# Patient Record
Sex: Female | Born: 1945 | Race: White | Hispanic: Yes | Smoking: Never smoker
Health system: Southern US, Community
[De-identification: ages and names within clinical notes are randomized; demographics above are authoritative.]

---

## 2020-08-09 ENCOUNTER — Ambulatory Visit (INDEPENDENT_AMBULATORY_CARE_PROVIDER_SITE_OTHER): Payer: Medicaid Other | Admitting: Internal Medicine

## 2020-08-09 ENCOUNTER — Encounter: Payer: Self-pay | Admitting: Internal Medicine

## 2020-08-09 ENCOUNTER — Other Ambulatory Visit: Payer: Self-pay

## 2020-08-09 VITALS — BP 116/60 | HR 72 | Ht 61.0 in | Wt 171.2 lb

## 2020-08-09 DIAGNOSIS — J849 Interstitial pulmonary disease, unspecified: Secondary | ICD-10-CM | POA: Diagnosis not present

## 2020-08-09 DIAGNOSIS — R0600 Dyspnea, unspecified: Secondary | ICD-10-CM

## 2020-08-09 DIAGNOSIS — R0609 Other forms of dyspnea: Secondary | ICD-10-CM

## 2020-08-09 DIAGNOSIS — R053 Chronic cough: Secondary | ICD-10-CM

## 2020-08-09 DIAGNOSIS — I358 Other nonrheumatic aortic valve disorders: Secondary | ICD-10-CM | POA: Diagnosis not present

## 2020-08-09 LAB — SEDIMENTATION RATE: Sed Rate: 12 mm/hr (ref 0–30)

## 2020-08-09 NOTE — Patient Instructions (Signed)
ICD-10-CM   1. ILD (interstitial lung disease) ([place])  J84.9     2. Dyspnea on exertion  R06.00     3. Chronic cough  R05.3     4. Aortic systolic murmur on examination  I35.8        - I am concerned you  have Interstitial Lung Disease (ILD) or Pulmonary FIbrosis (PF)  -  There are MANY varieties of this - To narrow down possibilities and assess severity please do the following tests  - do full PFT  - do walking test on room air in the office (not 6 min walk test) today or next visit  - do overnight oxygen test on room air  - do autoimmune panel: Serum: ESR, ANA, DS-DNA, RF, anti-CCP, ssA, ssB, scl-70, Total CK,  Aldolase,  Hypersensitivity Pneumonitis Panel, Quantiferon GOld  - do ECHO  Followup - reutrn in 4-8 weeks to see Dr Chase Caller to discuss test results and next step - 30 min visit
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
## 2020-08-11 LAB — QUANTIFERON-TB GOLD PLUS
Mitogen-NIL: >10 IU/mL
NIL: 1.04 IU/mL
QuantiFERON-TB Gold Plus: NEGATIVE
TB1-NIL: <0 IU/mL
TB2-NIL: <0 IU/mL

## 2020-08-11 LAB — CK TOTAL AND CKMB (NOT AT ARMC)
CK, MB: 0.9 ng/mL (ref 0–5.0)
Relative Index: 1.3 (ref 0–4.0)
Total CK: 67 U/L (ref 29–143)

## 2020-08-11 LAB — SJOGREN'S SYNDROME ANTIBODS(SSA + SSB)
SSA (Ro) (ENA) Antibody, IgG: <1 AI
SSB (La) (ENA) Antibody, IgG: <1 AI

## 2020-08-11 LAB — ANA: Anti Nuclear Antibody (ANA): NEGATIVE

## 2020-08-11 LAB — RHEUMATOID FACTOR: Rheumatoid fact SerPl-aCnc: <14 IU/mL (ref <14)

## 2020-08-11 LAB — ANTI-SCLERODERMA ANTIBODY: Scleroderma (Scl-70) (ENA) Antibody, IgG: <1 AI

## 2020-08-11 LAB — CYCLIC CITRUL PEPTIDE ANTIBODY, IGG: Cyclic Citrullin Peptide Ab: <16 UNITS

## 2020-08-11 LAB — ANTI-DNA ANTIBODY, DOUBLE-STRANDED: ds DNA Ab: 2 IU/mL

## 2020-08-11 LAB — ALDOLASE: Aldolase: 3.7 U/L (ref <=8.1)

## 2020-08-13 LAB — HYPERSENSITIVITY PNEUMONITIS
A. Pullulans Abs: NEGATIVE
A.Fumigatus #1 Abs: NEGATIVE
Micropolyspora faeni, IgG: NEGATIVE
Pigeon Serum Abs: NEGATIVE
Thermoact. Saccharii: NEGATIVE
Thermoactinomyces vulgaris, IgG: NEGATIVE

## 2020-08-15 NOTE — Progress Notes (Signed)
Serology for ILD work up is negative. Plse ensure followup per recent OV. I do not see followup. Thanks

## 2020-08-26 ENCOUNTER — Telehealth: Payer: Self-pay | Admitting: Internal Medicine

## 2020-08-26 ENCOUNTER — Encounter: Payer: Self-pay | Admitting: *Deleted

## 2020-08-26 NOTE — Telephone Encounter (Signed)
Call returned to patient daughter, confirmed DOB. Made aware of lab results. Voiced understanding.   Nothing further needed at this time.

## 2020-08-26 NOTE — Telephone Encounter (Signed)
ATC left voicemail to return call to office  

## 2020-08-29 ENCOUNTER — Telehealth: Payer: Self-pay | Admitting: Internal Medicine

## 2020-08-29 NOTE — Telephone Encounter (Signed)
Ono on 08/11/20 - pulse  <= 88% for 40sec  Pl;an  - no need for night o2

## 2020-08-30 ENCOUNTER — Other Ambulatory Visit: Payer: Self-pay

## 2020-08-30 ENCOUNTER — Ambulatory Visit (HOSPITAL_COMMUNITY)
Admission: RE | Admit: 2020-08-30 | Discharge: 2020-08-30 | Disposition: A | Discharge status: Home / Self Care | Payer: Medicaid Other | Source: Ambulatory Visit | Attending: Internal Medicine | Admitting: Internal Medicine

## 2020-08-30 DIAGNOSIS — J849 Interstitial pulmonary disease, unspecified: Secondary | ICD-10-CM | POA: Insufficient documentation

## 2020-08-30 DIAGNOSIS — I082 Rheumatic disorders of both aortic and tricuspid valves: Secondary | ICD-10-CM | POA: Insufficient documentation

## 2020-08-30 DIAGNOSIS — R0600 Dyspnea, unspecified: Secondary | ICD-10-CM | POA: Insufficient documentation

## 2020-08-30 DIAGNOSIS — R0609 Other forms of dyspnea: Secondary | ICD-10-CM | POA: Diagnosis not present

## 2020-08-30 LAB — ECHOCARDIOGRAM COMPLETE
Area-P 1/2: 3.6 cm2
P 1/2 time: 439 msec
S' Lateral: 2.2 cm

## 2020-08-30 NOTE — Telephone Encounter (Signed)
Called and spoke with pt's daughter Cathi Roan letting her know the results of pt's ONO and she verbalized understanding. Nothing  further needed.

## 2020-10-19 ENCOUNTER — Ambulatory Visit (INDEPENDENT_AMBULATORY_CARE_PROVIDER_SITE_OTHER): Payer: Medicaid Other | Admitting: Internal Medicine

## 2020-10-19 ENCOUNTER — Telehealth: Payer: Self-pay | Admitting: Internal Medicine

## 2020-10-19 ENCOUNTER — Other Ambulatory Visit: Payer: Self-pay

## 2020-10-19 ENCOUNTER — Encounter: Payer: Self-pay | Admitting: Internal Medicine

## 2020-10-19 VITALS — BP 108/70 | HR 75 | Temp 98.0°F | Ht 60.0 in | Wt 168.8 lb

## 2020-10-19 DIAGNOSIS — J849 Interstitial pulmonary disease, unspecified: Secondary | ICD-10-CM

## 2020-10-19 LAB — PULMONARY FUNCTION TEST
DL/VA % pred: 99 %
DL/VA: 4.2 ml/min/mmHg/L
DLCO cor % pred: 87 %
DLCO cor: 14.63 ml/min/mmHg
DLCO unc % pred: 88 %
DLCO unc: 14.72 ml/min/mmHg
FEF 25-75 Post: 2.71 L/s
FEF 25-75 Pre: 2.3 L/s
FEF2575-%Change-Post: 17 %
FEF2575-%Pred-Post: 182 %
FEF2575-%Pred-Pre: 155 %
FEV1-%Change-Post: 3 %
FEV1-%Pred-Post: 104 %
FEV1-%Pred-Pre: 101 %
FEV1-Post: 1.86 L
FEV1-Pre: 1.8 L
FEV1FVC-%Change-Post: 5 %
FEV1FVC-%Pred-Pre: 113 %
FEV6-%Change-Post: -1 %
FEV6-%Pred-Post: 92 %
FEV6-%Pred-Pre: 94 %
FEV6-Post: 2.09 L
FEV6-Pre: 2.13 L
FEV6FVC-%Pred-Post: 105 %
FEV6FVC-%Pred-Pre: 105 %
FVC-%Change-Post: -1 %
FVC-%Pred-Post: 87 %
FVC-%Pred-Pre: 89 %
FVC-Post: 2.09 L
FVC-Pre: 2.13 L
Post FEV1/FVC ratio: 89 %
Post FEV6/FVC ratio: 100 %
Pre FEV1/FVC ratio: 85 %
Pre FEV6/FVC Ratio: 100 %
RV % pred: 57 %
RV: 1.19 L
TLC % pred: 81 %
TLC: 3.63 L
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
## 2020-10-19 NOTE — Telephone Encounter (Signed)
    For radiology addendum:   - please call radiologist assistant line [phone] and specify Rachel Moody , 02-20-1946 and [id number] - mention imaging type ct chest at [facility] with different MRN  and date in PACS - see my note for the MRN - request addendum for purpose of - get that CT report to flow into this chart.    Please send phone message back when done  Thanks    SIGNATURE    Dr. Kalman Shan, M.D., F.C.C.P,  Pulmonary and Critical Care Medicine Staff Physician, [facility] Center Director - Interstitial Lung Disease  Program  Pulmonary Fibrosis [facility] at [facility] [place], [place], [address]  Pager: [phone], If no answer  OR between  19:00-7:00h: page [phone] Telephone (clinical office): [phone] Telephone (research): [phone]  5:46 PM 10/19/2020

## 2020-10-19 NOTE — Patient Instructions (Addendum)
ICD-10-CM   1. ILD (interstitial lung disease) (HCC)  J84.9     2. Dyspnea on exertion  R06.00     3. Chronic cough  R05.3     4. Aortic systolic murmur on examination  I35.8      ILD is very mild based on mild symptom, normal walking desaturation test and normal pulmonary function test  Symptom component is likely due to stiff heart muscle called diastolic dysfunction, weight and physical deconditioning and to some extent interstitial lung disease  Echo shows a mild amount of aortic valve regurgitation -please take this up with your cardiologist or primary care physician but probably no intervention  Plan - Details decision making and discussion about biopsy versus expectant follow-up  -Respect her reluctance for biopsy  -Through shared decision making to follow-up clinically -I will discuss your case at interstitial lung disease conference -Do high-resolution CT chest supine and prone in 6 months  Follow-up - Return in 6 months in a 15-minute slot to see Dr. Marchelle Gearing but after high-resolution CT chest  -Please do not do the CT scan in [place] but do it in [place] because it is a lot more convenient for our review if you do it in [place]

## 2020-10-19 NOTE — Progress Notes (Signed)
PFT done today. 

## 2020-10-22 NOTE — Telephone Encounter (Signed)
[facility] radiology and spoke with [facility] about the message stated by MR. When Stacie tried locating pt, she was not able to locate pt in the system. Stacie said that she was going to call [facility] to try to see if she could get this straightened out and then would call us back.

## 2021-04-26 ENCOUNTER — Ambulatory Visit
Admission: RE | Admit: 2021-04-26 | Discharge: 2021-04-26 | Disposition: A | Discharge status: Home / Self Care | Payer: Medicaid Other | Source: Ambulatory Visit | Attending: Internal Medicine | Admitting: Internal Medicine

## 2021-04-26 ENCOUNTER — Other Ambulatory Visit: Payer: Self-pay

## 2021-04-26 DIAGNOSIS — J849 Interstitial pulmonary disease, unspecified: Secondary | ICD-10-CM

## 2021-06-13 ENCOUNTER — Encounter: Payer: Self-pay | Admitting: Internal Medicine

## 2021-06-13 ENCOUNTER — Ambulatory Visit (INDEPENDENT_AMBULATORY_CARE_PROVIDER_SITE_OTHER): Payer: Medicaid Other | Admitting: Internal Medicine

## 2021-06-13 VITALS — BP 130/74 | HR 79 | Temp 98.2°F | Ht 60.0 in | Wt 167.0 lb

## 2021-06-13 DIAGNOSIS — J849 Interstitial pulmonary disease, unspecified: Secondary | ICD-10-CM

## 2021-06-13 NOTE — Patient Instructions (Addendum)
ICD-10-CM   ?1. ILD (interstitial lung disease) (HCC)  J84.9   ?  ? ? ?ILD is very mild based ?This can get worse in the future but currently you are stable ? ?Plan - per shared decision making ?- No biopsy ?- Expectant followup ?- Do spirometry and dlco in 6 months ? ?Follow-up ?- Return in 6 months in a 30-minute slot to see Dr. Marchelle Gearing but after PFT ?   - Symptom score and walk test at followup ?
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
## 2021-06-14 ENCOUNTER — Telehealth: Payer: Self-pay | Admitting: Internal Medicine

## 2021-06-14 NOTE — Telephone Encounter (Signed)
Pt's OV notes as well as echo results have been printed to be faxed to provided fax number. ?

## 2021-06-15 NOTE — Telephone Encounter (Signed)
Echo has been refaxed to provided fax number. Nothing further needed. ?

## 2021-12-15 ENCOUNTER — Ambulatory Visit: Payer: Medicaid Other | Admitting: Internal Medicine

## 2021-12-15 ENCOUNTER — Other Ambulatory Visit: Payer: Self-pay | Admitting: *Deleted

## 2021-12-15 DIAGNOSIS — J849 Interstitial pulmonary disease, unspecified: Secondary | ICD-10-CM

## 2021-12-16 ENCOUNTER — Ambulatory Visit (INDEPENDENT_AMBULATORY_CARE_PROVIDER_SITE_OTHER): Payer: Medicaid Other | Admitting: Internal Medicine

## 2021-12-16 DIAGNOSIS — J849 Interstitial pulmonary disease, unspecified: Secondary | ICD-10-CM

## 2021-12-16 LAB — PULMONARY FUNCTION TEST
DL/VA % pred: 113 %
DL/VA: 4.8 ml/min/mmHg/L
DLCO cor % pred: 87 %
DLCO cor: 14.45 ml/min/mmHg
DLCO unc % pred: 87 %
DLCO unc: 14.45 ml/min/mmHg
FEF 25-75 Pre: 2.31 L/sec
FEF2575-%Pred-Pre: 166 %
FEV1-%Pred-Pre: 100 %
FEV1-Pre: 1.72 L
FEV1FVC-%Pred-Pre: 114 %
FEV6-%Pred-Pre: 92 %
FEV6-Pre: 2 L
FEV6FVC-%Pred-Pre: 105 %
FVC-%Pred-Pre: 87 %
FVC-Pre: 2.02 L
Pre FEV1/FVC ratio: 85 %
Pre FEV6/FVC Ratio: 100 %

## 2021-12-16 NOTE — Patient Instructions (Signed)
Spirometry/DLCO performed today. 

## 2021-12-16 NOTE — Progress Notes (Signed)
Spirometry/DLCO performed today. 

## 2021-12-20 ENCOUNTER — Ambulatory Visit (INDEPENDENT_AMBULATORY_CARE_PROVIDER_SITE_OTHER): Payer: Medicaid Other | Admitting: Internal Medicine

## 2021-12-20 ENCOUNTER — Encounter: Payer: Self-pay | Admitting: Internal Medicine

## 2021-12-20 VITALS — BP 116/68 | HR 58 | Temp 97.8°F | Ht 60.0 in | Wt 168.2 lb

## 2021-12-20 DIAGNOSIS — J849 Interstitial pulmonary disease, unspecified: Secondary | ICD-10-CM | POA: Diagnosis not present

## 2021-12-20 DIAGNOSIS — Z7185 Encounter for immunization safety counseling: Secondary | ICD-10-CM | POA: Diagnosis not present
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
## 2021-12-20 NOTE — Patient Instructions (Addendum)
    ILD  Continues to be stable Noted that you are now potentially interested in antifibrotic therapy Noted that you still are not interested in lung biopsy  Plan  - Pulmonary function test spirometry and DLCO in 3 months -I will discuss in case conference whether we should start upfront antifibrotic therapy or continue to watch   Vaccine counseling  - Glad you had the flu shot  Plan -Recommend RSV vaccine -Recommend COVID mRNA booster for the fall 2023 -Have pneumonia vaccine either with [place] today or through primary care -Please talk to PCP Rhea Bleacher, NP -  and ensure you get  shingrix (GSK) inactivated vaccine against shingles    -Followup  - 3 months - 30 min visit; but after spiro/dlco  - walk and ILD symptoms score at followup

## 2021-12-22 ENCOUNTER — Ambulatory Visit: Payer: Medicaid Other | Admitting: Internal Medicine

## 2022-03-27 ENCOUNTER — Ambulatory Visit: Payer: Medicaid Other | Admitting: Nurse Practitioner

## 2022-03-31 ENCOUNTER — Encounter: Payer: Self-pay | Admitting: Nurse Practitioner

## 2022-03-31 ENCOUNTER — Ambulatory Visit (INDEPENDENT_AMBULATORY_CARE_PROVIDER_SITE_OTHER): Payer: Medicaid Other | Admitting: Internal Medicine

## 2022-03-31 ENCOUNTER — Ambulatory Visit (INDEPENDENT_AMBULATORY_CARE_PROVIDER_SITE_OTHER): Payer: Medicaid Other | Admitting: Nurse Practitioner

## 2022-03-31 VITALS — BP 144/78 | HR 65 | Temp 98.7°F | Ht 60.0 in | Wt 164.2 lb

## 2022-03-31 DIAGNOSIS — R942 Abnormal results of pulmonary function studies: Secondary | ICD-10-CM | POA: Diagnosis not present

## 2022-03-31 DIAGNOSIS — J849 Interstitial pulmonary disease, unspecified: Secondary | ICD-10-CM

## 2022-03-31 LAB — CBC WITH DIFFERENTIAL/PLATELET
Basophils Absolute: 0 10*3/uL (ref 0.0–0.1)
Basophils Relative: 0.3 % (ref 0.0–3.0)
Eosinophils Absolute: 0.1 10*3/uL (ref 0.0–0.7)
Eosinophils Relative: 2.6 % (ref 0.0–5.0)
HCT: 39.7 % (ref 36.0–46.0)
Hemoglobin: 13.2 g/dL (ref 12.0–15.0)
Lymphocytes Relative: 32.6 % (ref 12.0–46.0)
Lymphs Abs: 1.7 10*3/uL (ref 0.7–4.0)
MCHC: 33.2 g/dL (ref 30.0–36.0)
MCV: 89.1 fl (ref 78.0–100.0)
Monocytes Absolute: 0.5 10*3/uL (ref 0.1–1.0)
Monocytes Relative: 10 % (ref 3.0–12.0)
Neutro Abs: 2.9 10*3/uL (ref 1.4–7.7)
Neutrophils Relative %: 54.5 % (ref 43.0–77.0)
Platelets: 188 10*3/uL (ref 150.0–400.0)
RBC: 4.46 Mil/uL (ref 3.87–5.11)
RDW: 14.2 % (ref 11.5–15.5)
WBC: 5.3 10*3/uL (ref 4.0–10.5)

## 2022-03-31 LAB — PULMONARY FUNCTION TEST
DL/VA % pred: 91 %
DL/VA: 3.85 ml/min/mmHg/L
DLCO cor % pred: 79 %
DLCO cor: 13.14 ml/min/mmHg
DLCO unc % pred: 79 %
DLCO unc: 13.14 ml/min/mmHg
FEF 25-75 Pre: 2.39 L/sec
FEF2575-%Pred-Pre: 172 %
FEV1-%Pred-Pre: 103 %
FEV1-Pre: 1.77 L
FEV1FVC-%Pred-Pre: 113 %
FEV6-%Pred-Pre: 93 %
FEV6-Pre: 2.04 L
FEV6FVC-%Pred-Pre: 105 %
FVC-%Pred-Pre: 90 %
FVC-Pre: 2.09 L
Pre FEV1/FVC ratio: 85 %
Pre FEV6/FVC Ratio: 100 %

## 2022-03-31 NOTE — Progress Notes (Signed)
CBC nl. Please let patient know we will plan to repeat her PFTs in 3 months, as discussed. Thanks!

## 2022-03-31 NOTE — Assessment & Plan Note (Addendum)
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 She remains asymptomatic. She had repeat PFTs today with slight reduction in DLCO. We discussed that this could represent progression of disease or it could be related to alternative etiology vs testing variability. We will check CBC today to rule out anemia. Plan to repeat PFTs again in 3 months to reassess. Shared decision to continue with watchful waiting. If her PFTs do show persistent decrease in DLCO or worsening, we will obtain repeat HRCT. If there is evidence of progression, she would be agreeable to antifibrotic therapy.   Patient Instructions  Continue Albuterol inhaler 2 puffs every 6 hours as needed for shortness of breath or wheezing.   Your diffusion capacity was slightly reduced today compared to previous testing. We will plan to check your blood counts to ensure there is no evidence of anemia. If this is normal, we will recheck your pulmonary function testing in 3 months.  Follow up in 12 weeks after spirometry/DLCO with Dr. Chase Caller. If symptoms do not improve or worsen, please contact office for sooner follow up or seek emergency care.

## 2022-03-31 NOTE — Progress Notes (Signed)
Spirometry and DLCO Performed Today.  

## 2022-03-31 NOTE — Patient Instructions (Signed)
Spirometry and DLCO Performed Today.  

## 2022-03-31 NOTE — Patient Instructions (Addendum)
Continue Albuterol inhaler 2 puffs every 6 hours as needed for shortness of breath or wheezing.   Your diffusion capacity was slightly reduced today compared to previous testing. We will plan to check your blood counts to ensure there is no evidence of anemia. If this is normal, we will recheck your pulmonary function testing in 3 months.  Follow up in 12 weeks after spirometry/DLCO with Dr. Chase Caller. If symptoms do not improve or worsen, please contact office for sooner follow up or seek emergency care.

## 2022-03-31 NOTE — Progress Notes (Signed)
$@Patientt$  ID: Rachel Moody, female    DOB: March 07, 1945, 77 y.o.   MRN: [id number]  Chief Complaint  Patient presents with   Follow-up    PFT today.  Patient states doing well.    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 She is a patient of Dr. Golden Pop and last seen in office 12/20/2021. Past medical history significant for HTN, HLD.  TEST/EVENTS:   12/20/2021: OV with Dr. Chase Caller. PFTs were stable. Breathing stable. Walk test was also stable. Opted for no biopsy. 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 Shared decision for expectant follow up but then patient asked about medication options to cure disease. Reviewed role of antifibrotics but unsure if there is a role for this unless it is determined that she does have IPF. Plan to discuss at multidisciplinary conference. Repeat PFTs in 3 months.   03/31/2022: Today - follow up Patient presents today for follow up after PFTs. She is accompanied by her daughter and spanish interpretor. Her spirometry was normal and stable from previous testing. She did have a slight reduction in her DLCO to 79%, which is an 8% drop from her testing in October. She tells me that she has been doing well. She really does not have any respiratory symptoms that she notices. She denies shortness of breath, cough or fatigue. No lightheadedness, dizziness, or weakness.   SYMPTOM SCALE - ILD 08/09/2020   10/19/2020   06/13/2021   12/20/2021   03/31/2022  O2 use ra ra ra ra ra  Shortness of Breath 0 -> 5 scale with 5 being worst (score 6 If unable to do)         At rest 0 0 0 0 0  Simple tasks - showers, clothes change, eating, shaving 0 0 0 0 0  Household (dishes, doing bed, laundry) 0 0 0 0 0  Shopping 0 0.5 0 0 0  Walking level at own pace 1 0.5 0 0 0  Walking up Stairs 3 2.5 0 0 0  Total (30-36) Dyspnea Score 4 3.5 0 0 0  How bad is your cough? 2 2.5 1  0 0  How bad is your fatigue 1 1.5 0 0 0  How bad is nausea 0 0 0 0 0  How bad is vomiting?   0 0 0 0 0  How bad is diarrhea? 0 0 0 0 0  How bad is anxiety? 00 0 0 0 0  How bad is depression 0 0 0 0 0      0 0 0 0      Simple office walk 185 feet x  3 laps goal with forehead probe 10/19/2020   06/13/2021   12/20/2021   03/31/2022  O2 used ra     ra  Number laps completed avg     avg  Comments about pace 3 3 3 $ 3; walked at a very fast pace  Resting Pulse Ox/HR 100% and 75/min 100% and 78 100%  and 74 100%  Final Pulse Ox/HR 98% and 90/min 98% and 100 100% and 85   Desaturated </= 88% no     no  Desaturated <= 3% points no     no  Got Tachycardic >/= 90/min yes       Symptoms at end of test none none   none  Miscellaneous comments x  Not on File  Immunization History  Administered Date(s) Administered   Fluad Quad(high Dose 65+) 12/05/2021   Influenza, High Dose Seasonal PF 11/21/2019   Moderna Sars-Covid-2 Vaccination 04/17/2019, 05/15/2019   PFIZER(Purple Top)SARS-COV-2 Vaccination 03/24/2020    No past medical history on file.  Tobacco History: Social History   Tobacco Use  Smoking Status Never  Smokeless Tobacco Never   Counseling given: Not Answered   Outpatient Medications Prior to Visit  Medication Sig Dispense Refill   Ascorbic Acid (VITAMIN C) 500 MG CAPS Take 500 mg by mouth daily.     hydrochlorothiazide (HYDRODIURIL) 25 MG tablet Take 1 tablet by mouth daily.     losartan (COZAAR) 50 MG tablet Take 1 tablet by mouth 2 (two) times daily.     PROAIR HFA 108 (90 Base) MCG/ACT inhaler Inhale into the lungs.     rosuvastatin (CRESTOR) 10 MG tablet Take 10 mg by mouth at bedtime.     Vitamin D3 (VITAMIN D) 25 MCG tablet Take 1,000 Units by mouth daily.     zinc gluconate 50 MG tablet Take 50 mg by mouth daily. (Patient not taking: Reported on 03/31/2022)     No facility-administered medications prior to visit.     Review of Systems:    Constitutional: No weight loss or gain, night sweats, fevers, chills, fatigue, or lassitude. HEENT: No headaches, difficulty swallowing, tooth/dental problems, or sore throat. No sneezing, itching, ear ache, nasal congestion, or post nasal drip CV:  No chest pain, orthopnea, PND, swelling in lower extremities, anasarca, dizziness, palpitations, syncope Resp: No shortness of breath with exertion or at rest. No excess mucus or change in color of mucus. No productive or non-productive. No hemoptysis. No wheezing.  No chest wall deformity GI:  No heartburn, indigestion, abdominal pain, nausea, vomiting, diarrhea, change in bowel habits, loss of appetite GU: No dysuria, change in color of urine, urgency or frequency.  Skin: No rash, lesions, ulcerations MSK:  No joint pain or swelling.  Neuro: No dizziness or lightheadedness.  Psych: No depression or anxiety. Mood stable.     Physical Exam:  BP (!) 144/78 (BP Location: Right Arm, Patient Position: Sitting, Cuff Size: Normal)   Pulse 65   Temp 98.7 F (37.1 C) (Oral)   Ht 5' (1.524 m)   Wt 164 lb 3.2 oz (74.5 kg)   SpO2 99%   BMI 32.07 kg/m   GEN: Pleasant, interactive, well-appearing; obese; in no acute distress. HEENT:  Normocephalic and atraumatic. PERRLA. Sclera white. Nasal turbinates pink, moist and patent bilaterally. No rhinorrhea present. Oropharynx pink and moist, without exudate or edema. No lesions, ulcerations, or postnasal drip.  NECK:  Supple w/ fair ROM. No JVD present. Normal carotid impulses w/o bruits. Thyroid symmetrical with no goiter or nodules palpated. No lymphadenopathy.   CV: RRR, no m/r/g, no peripheral edema. Pulses intact, +2 bilaterally. No cyanosis, pallor or clubbing. PULMONARY:  Unlabored, regular breathing. Question mild bibasilar crackles otherwise clear bilaterally A&P w/o wheezes/rales/rhonchi. No accessory muscle use.  GI: BS present and normoactive. Soft, non-tender to palpation. No organomegaly or  masses detected.  MSK: No erythema, warmth or tenderness. Cap refil <2 sec all extrem. No deformities or joint swelling noted.  Neuro: A/Ox3. No focal deficits noted.   Skin: Warm, no lesions or rashe Psych: Normal affect and behavior. Judgement and thought content appropriate.     Lab Results:  CBC    Component Value Date/Time   WBC 5.3 03/31/2022 1031   RBC 4.46  03/31/2022 1031   HGB 13.2 03/31/2022 1031   HCT 39.7 03/31/2022 1031   PLT 188.0 03/31/2022 1031   MCV 89.1 03/31/2022 1031   MCHC 33.2 03/31/2022 1031   RDW 14.2 03/31/2022 1031   LYMPHSABS 1.7 03/31/2022 1031   MONOABS 0.5 03/31/2022 1031   EOSABS 0.1 03/31/2022 1031   BASOSABS 0.0 03/31/2022 1031    BMET No results found for: "NA", "K", "CL", "CO2", "GLUCOSE", "BUN", "CREATININE", "CALCIUM", "GFRNONAA", "GFRAA"  BNP No results found for: "BNP"   Imaging:  No results found.       Latest Ref Rng & Units 03/31/2022    9:02 AM 12/16/2021   12:27 PM 10/19/2020    3:02 PM  PFT Results  FVC-Pre L 2.09  P 2.02  2.13   FVC-Predicted Pre % 90  P 87  89   FVC-Post L   2.09   FVC-Predicted Post %   87   Pre FEV1/FVC % % 85  P 85  85   Post FEV1/FCV % %   89   FEV1-Pre L 1.77  P 1.72  1.80   FEV1-Predicted Pre % 103  P 100  101   FEV1-Post L   1.86   DLCO uncorrected ml/min/mmHg 13.14  P 14.45  14.72   DLCO UNC% % 79  P 87  88   DLCO corrected ml/min/mmHg 13.14  P 14.45  14.63   DLCO COR %Predicted % 79  P 87  87   DLVA Predicted % 91  P 113  99   TLC L   3.63   TLC % Predicted %   81   RV % Predicted %   57     P Preliminary result    No results found for: "NITRICOXIDE"      Assessment & Plan:   ILD (interstitial lung disease) (HCC) ILD with mild changes of UIP pattern on imaging but without evidence of progression thus far. She remains asymptomatic. She had repeat PFTs today with slight reduction in DLCO. We discussed that this could represent progression of disease or it could be related to  alternative etiology vs testing variability. We will check CBC today to rule out anemia. Plan to repeat PFTs again in 3 months to reassess. Shared decision to continue with watchful waiting. If her PFTs do show persistent decrease in DLCO or worsening, we will obtain repeat HRCT. If there is evidence of progression, she would be agreeable to antifibrotic therapy.   Patient Instructions  Continue Albuterol inhaler 2 puffs every 6 hours as needed for shortness of breath or wheezing.   Your diffusion capacity was slightly reduced today compared to previous testing. We will plan to check your blood counts to ensure there is no evidence of anemia. If this is normal, we will recheck your pulmonary function testing in 3 months.  Follow up in 12 weeks after spirometry/DLCO with Dr. Chase Caller. If symptoms do not improve or worsen, please contact office for sooner follow up or seek emergency care.       I spent 35 minutes of dedicated to the care of this patient on the date of this encounter to include pre-visit review of records, face-to-face time with the patient discussing conditions above, post visit ordering of testing, clinical documentation with the electronic health record, making appropriate referrals as documented, and communicating necessary findings to members of the patients care team.  Clayton Bibles, NP 03/31/2022  Pt aware and understands NP's role.

## 2022-07-28 IMAGING — CT CT CHEST HIGH RESOLUTION
1 of 6 series · 14 of 34 positions shown, 18 images · non-contrast
Comparison: None.

CLINICAL DATA: Interstitial lung disease, normal PFT, dyspnea and
chronic cough



[Series 10: super d · axial · 0.72mm/px · z∈[-343,-82]mm · 14 of 491 slices shown, 18 images]
[im 28/491  mediastinal]
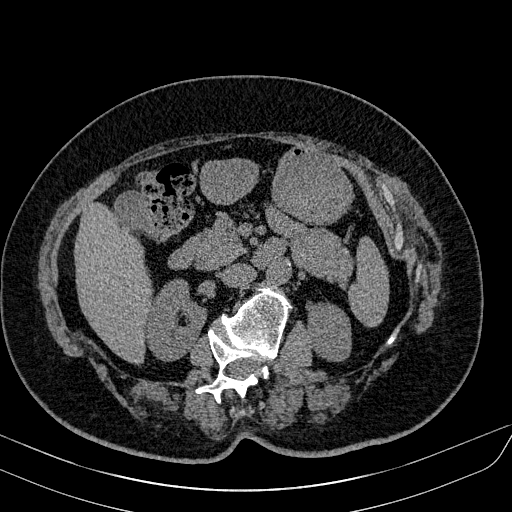
[im 28/491  lung]
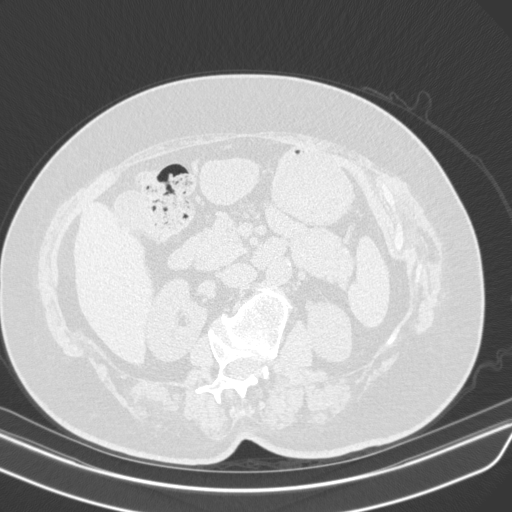
[im 55/491  lung]
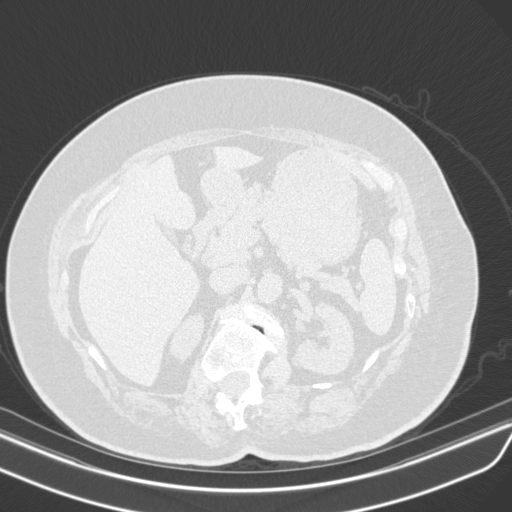
[im 109/491  lung]
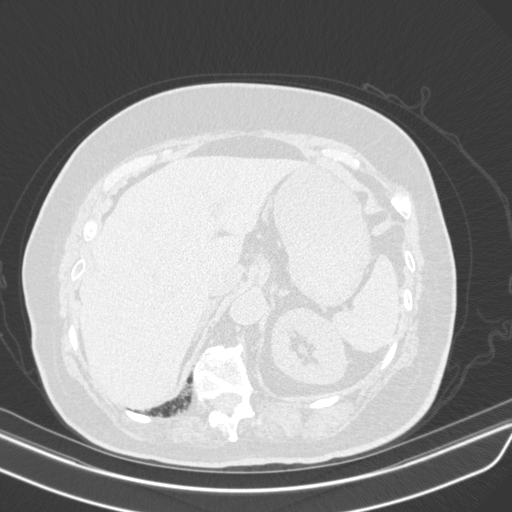
[im 137/491  lung]
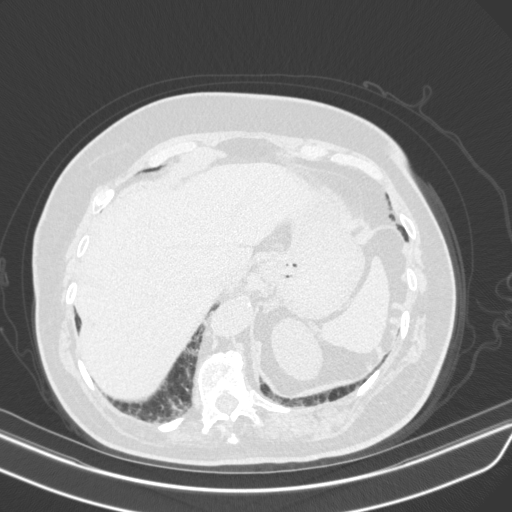
[im 164/491  mediastinal]
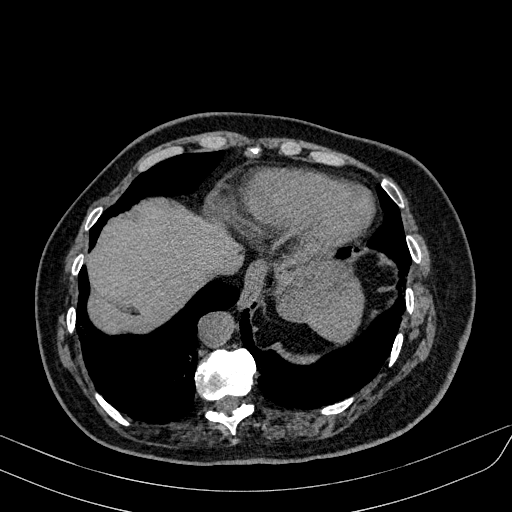
[im 164/491  lung]
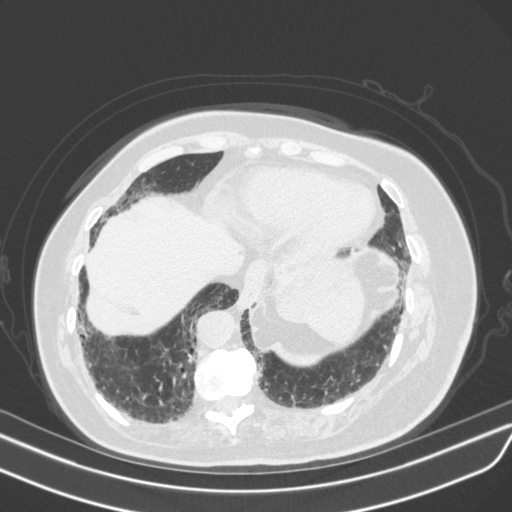
[im 191/491  lung]
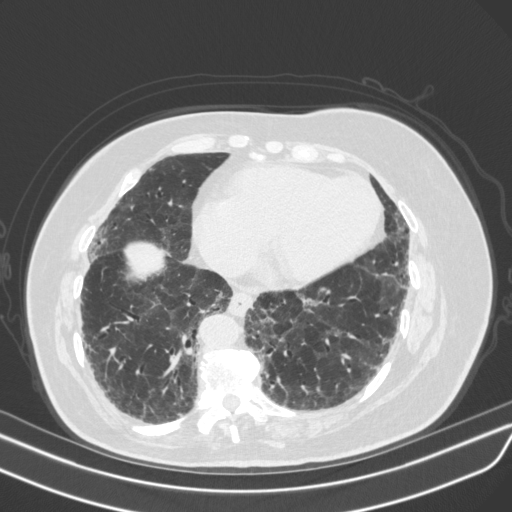
[im 218/491  lung]
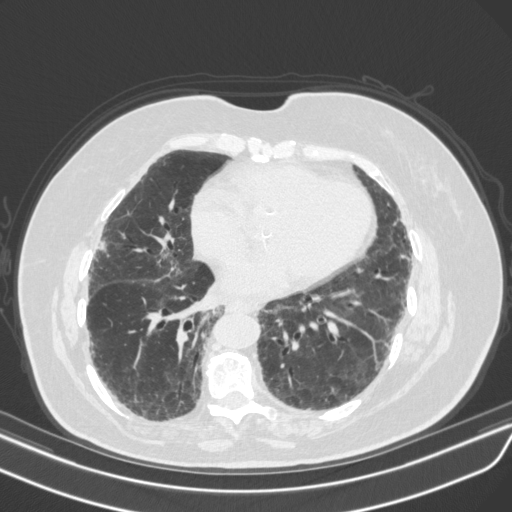
[im 246/491  lung]
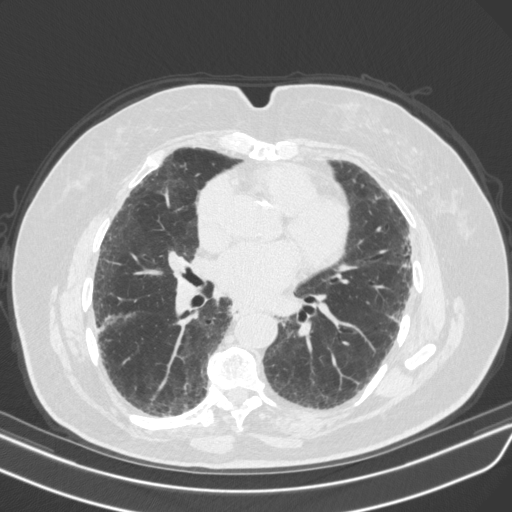
[im 273/491  mediastinal]
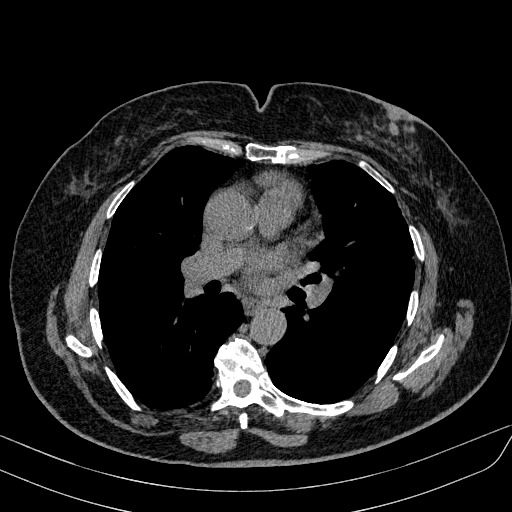
[im 273/491  lung]
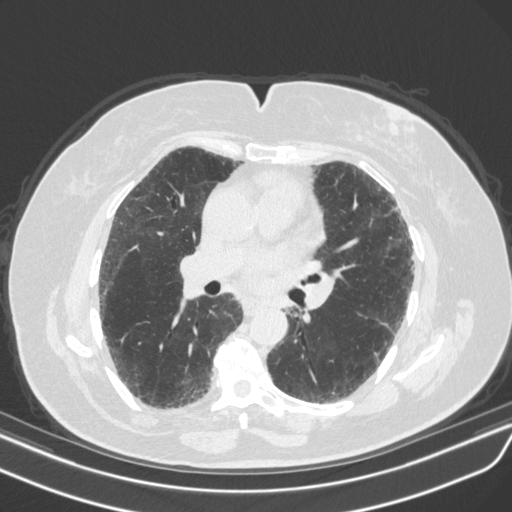
[im 327/491  lung]
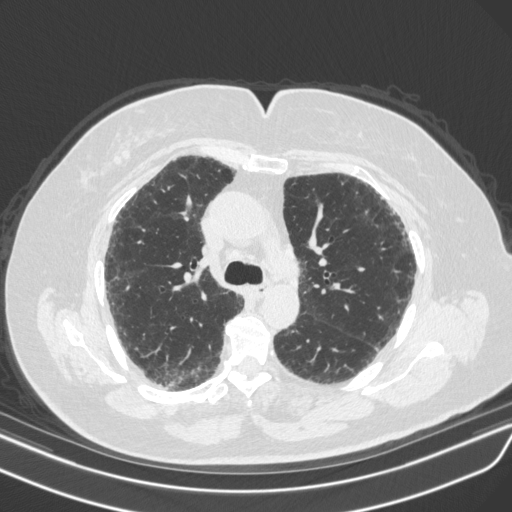
[im 354/491  lung]
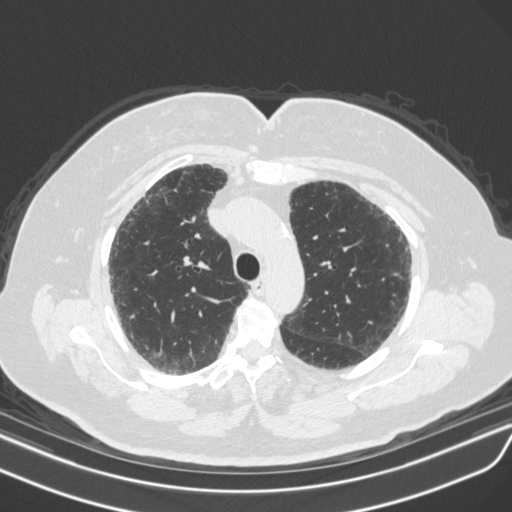
[im 382/491  lung]
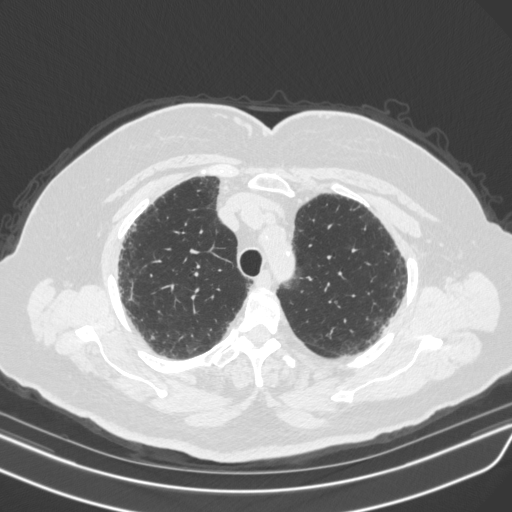
[im 436/491  mediastinal]
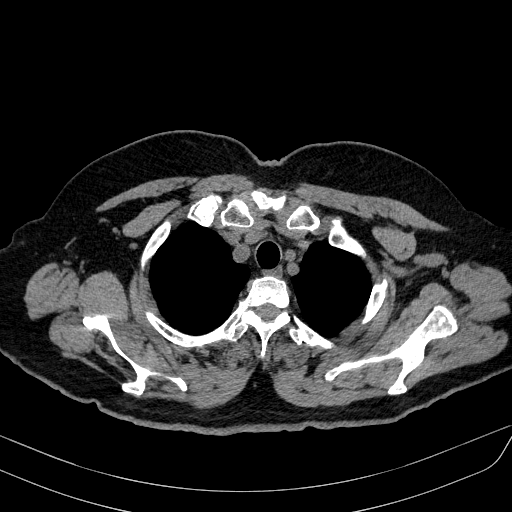
[im 436/491  lung]
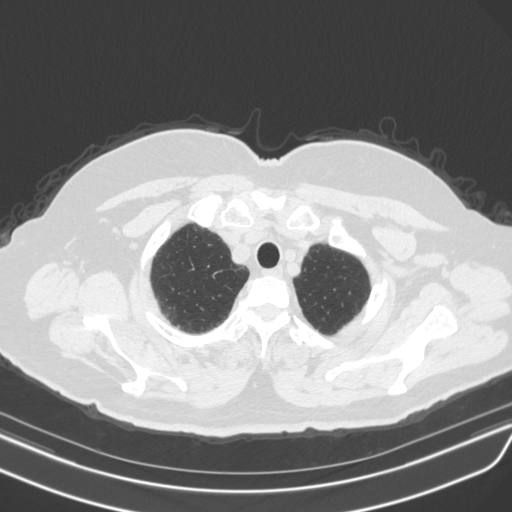
[im 463/491  lung]
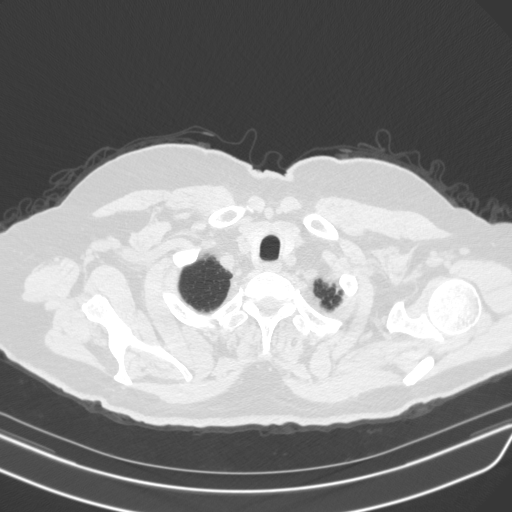

[14 of 34 positions shown; findings below may reference images not displayed]

FINDINGS: Cardiovascular: Aortic atherosclerosis. Normal heart size. No
pericardial effusion.

Mediastinum/Nodes: No enlarged mediastinal, hilar, or axillary lymph
nodes. Thyroid gland, trachea, and esophagus demonstrate no
significant findings.

Lungs/Pleura: Mild pulmonary fibrosis in a pattern with apical to
basal gradient featuring irregular peripheral interstitial opacity,
septal thickening, traction bronchiectasis, and areas of subpleural
bronchiolectasis at the lung basis without clear evidence of
honeycombing. No significant air trapping on expiratory phase
imaging. No pleural effusion or pneumothorax.

Upper Abdomen: No acute abnormality.

Musculoskeletal: No chest wall abnormality. No suspicious osseous
lesions identified. Dextroscoliosis of the thoracolumbar spine with
associated disc degenerative disease.
IMPRESSION: Mild pulmonary fibrosis in a pattern with apical to basal gradient
featuring irregular peripheral interstitial opacity, septal
thickening, traction bronchiectasis, and areas of subpleural
bronchiolectasis at the lung basis without clear evidence of
honeycombing. Findings are categorized as probable UIP per consensus
guidelines: Diagnosis of Idiopathic Pulmonary Fibrosis: An Official
ATS/ERS/JRS/ALAT Clinical Practice Guideline. Am J Respir Crit Care
Med Vol 198, [place] 5, [id number][DATE].

Aortic Atherosclerosis (LO15F-D1S.S).

## 2022-08-28 ENCOUNTER — Ambulatory Visit (INDEPENDENT_AMBULATORY_CARE_PROVIDER_SITE_OTHER): Payer: Medicaid Other | Admitting: Internal Medicine

## 2022-08-28 DIAGNOSIS — J849 Interstitial pulmonary disease, unspecified: Secondary | ICD-10-CM

## 2022-08-28 LAB — PULMONARY FUNCTION TEST
DL/VA % pred: 98 %
DL/VA: 4.15 ml/min/mmHg/L
DLCO cor % pred: 79 %
DLCO cor: 13.45 ml/min/mmHg
DLCO unc % pred: 79 %
DLCO unc: 13.45 ml/min/mmHg
FEF 25-75 Pre: 1.77 L/sec
FEF2575-%Pred-Pre: 125 %
FEV1-%Pred-Pre: 92 %
FEV1-Pre: 1.62 L
FEV1FVC-%Pred-Pre: 109 %
FEV6-%Pred-Pre: 88 %
FEV6-Pre: 1.97 L
FEV6FVC-%Pred-Pre: 105 %
FVC-%Pred-Pre: 84 %
FVC-Pre: 1.98 L
Pre FEV1/FVC ratio: 81 %
Pre FEV6/FVC Ratio: 100 %

## 2022-08-28 NOTE — Progress Notes (Signed)
Spiro/DLCO performed today.  

## 2022-08-28 NOTE — Patient Instructions (Signed)
Spiro/DLCO performed today.  

## 2022-09-08 NOTE — Progress Notes (Signed)
Slight decline in lung function but diffusing capacity (gas exchange) is stable. Will repeat in 3 months for monitoring. Follow up as scheduled. Thanks

## 2022-09-12 ENCOUNTER — Ambulatory Visit: Payer: Medicaid Other | Admitting: Internal Medicine

## 2022-10-30 ENCOUNTER — Ambulatory Visit (INDEPENDENT_AMBULATORY_CARE_PROVIDER_SITE_OTHER): Payer: Medicaid Other | Admitting: Internal Medicine

## 2022-10-30 ENCOUNTER — Encounter: Payer: Self-pay | Admitting: Internal Medicine

## 2022-10-30 VITALS — BP 104/60 | HR 57 | Temp 98.3°F | Ht 60.0 in | Wt 164.0 lb

## 2022-10-30 DIAGNOSIS — Z23 Encounter for immunization: Secondary | ICD-10-CM | POA: Diagnosis not present

## 2022-10-30 DIAGNOSIS — J849 Interstitial pulmonary disease, unspecified: Secondary | ICD-10-CM | POA: Diagnosis not present

## 2022-10-30 DIAGNOSIS — Z7185 Encounter for immunization safety counseling: Secondary | ICD-10-CM | POA: Diagnosis not present

## 2022-10-30 DIAGNOSIS — Z789 Other specified health status: Secondary | ICD-10-CM | POA: Diagnosis not present
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
## 2022-10-30 NOTE — Addendum Note (Signed)
Addended by: Christen Butter on: 10/30/2022 10:40 AM   Modules accepted: Orders

## 2022-10-30 NOTE — Patient Instructions (Addendum)
ICD-10-CM   1. ILD (interstitial lung disease) (HCC)  J84.9     2. Vaccine counseling  Z71.85         ILD  Very mild changes in breathing test but stil technically range bound No change I symptoms or exercise hypxoemia test   Plan  HRCT in 6 months   Vaccine counseling  -   Plan - High dose flu shot 10/30/2022 - Please talk to PCP Erskine Emery, NP -  and ensure you get  shingrix (GSK) inactivated vaccine against shingles - RSV vaccine on your own    -Followup  - 6 months - 15 min visit; but after HRCT  - walk and ILD symptoms score at followup

## 2023-04-30 ENCOUNTER — Other Ambulatory Visit: Payer: Medicaid Other

## 2023-08-08 ENCOUNTER — Ambulatory Visit
Admission: RE | Admit: 2023-08-08 | Discharge: 2023-08-08 | Disposition: A | Discharge status: Home / Self Care | Source: Ambulatory Visit | Attending: Internal Medicine | Admitting: Internal Medicine

## 2023-08-08 DIAGNOSIS — J849 Interstitial pulmonary disease, unspecified: Secondary | ICD-10-CM

## 2023-10-31 ENCOUNTER — Ambulatory Visit: Payer: Self-pay | Admitting: Internal Medicine

## 2023-10-31 DIAGNOSIS — J849 Interstitial pulmonary disease, unspecified: Secondary | ICD-10-CM

## 2023-10-31 NOTE — Telephone Encounter (Signed)
    Last seen Sept 2024 CT done June 2025 Was supposed to see me after that but no visit    Plan - tell her fibrosis is getting worse  - do spiro and dlco first avail - 30 min with MR/APP to discuss anti fibrotic     Latest Ref Rng & Units 08/28/2022   10:59 AM 03/31/2022    9:02 AM 12/16/2021   12:27 PM 10/19/2020    3:02 PM  PFT Results  FVC-Pre L 1.98  2.09  2.02  2.13   FVC-Predicted Pre % 84  90  87  89   FVC-Post L    2.09   FVC-Predicted Post %    87   Pre FEV1/FVC % % 81  85  85  85   Post FEV1/FCV % %    89   FEV1-Pre L 1.62  1.77  1.72  1.80   FEV1-Predicted Pre % 92  103  100  101   FEV1-Post L    1.86   DLCO uncorrected ml/min/mmHg 13.45  13.14  14.45  14.72   DLCO UNC% % 79  79  87  88   DLCO corrected ml/min/mmHg 13.45  13.14  14.45  14.63   DLCO COR %Predicted % 79  79  87  87   DLVA Predicted % 98  91  113  99   TLC L    3.63   TLC % Predicted %    81   RV % Predicted %    57

## 2023-11-21 NOTE — Telephone Encounter (Signed)
 Attempted to call patient with interpreter. Left voicemail for patient to call our office back to get scheduled for PFT and follow up.

## 2023-11-30 NOTE — Progress Notes (Signed)
 ATC 2x left VM

## 2023-12-03 NOTE — Progress Notes (Signed)
 SENT LTR
